# Patient Record
Sex: Female | Born: 1980 | Race: White | Hispanic: No | Marital: Single | Smoking: Never smoker
Health system: Southern US, Community
[De-identification: ages and names within clinical notes are randomized; demographics above are authoritative.]

## PROBLEM LIST (undated history)

## (undated) DIAGNOSIS — Z77120 Contact with and (suspected) exposure to mold (toxic): Secondary | ICD-10-CM

## (undated) DIAGNOSIS — E039 Hypothyroidism, unspecified: Secondary | ICD-10-CM

## (undated) DIAGNOSIS — E559 Vitamin D deficiency, unspecified: Secondary | ICD-10-CM

## (undated) DIAGNOSIS — Z87442 Personal history of urinary calculi: Secondary | ICD-10-CM

## (undated) DIAGNOSIS — U071 COVID-19: Secondary | ICD-10-CM

## (undated) DIAGNOSIS — F419 Anxiety disorder, unspecified: Secondary | ICD-10-CM

## (undated) DIAGNOSIS — R7989 Other specified abnormal findings of blood chemistry: Secondary | ICD-10-CM

## (undated) DIAGNOSIS — Q819 Epidermolysis bullosa, unspecified: Secondary | ICD-10-CM

## (undated) DIAGNOSIS — E618 Deficiency of other specified nutrient elements: Secondary | ICD-10-CM

## (undated) DIAGNOSIS — G4700 Insomnia, unspecified: Secondary | ICD-10-CM

## (undated) DIAGNOSIS — M2550 Pain in unspecified joint: Secondary | ICD-10-CM

## (undated) DIAGNOSIS — R002 Palpitations: Secondary | ICD-10-CM

## (undated) DIAGNOSIS — R5383 Other fatigue: Secondary | ICD-10-CM

## (undated) HISTORY — DX: Insomnia, unspecified: G47.00

## (undated) HISTORY — DX: Palpitations: R00.2

## (undated) HISTORY — DX: Vitamin D deficiency, unspecified: E55.9

## (undated) HISTORY — DX: Anxiety disorder, unspecified: F41.9

## (undated) HISTORY — DX: Deficiency of other specified nutrient elements: E61.8

## (undated) HISTORY — DX: Other fatigue: R53.83

## (undated) HISTORY — DX: Other specified abnormal findings of blood chemistry: R79.89

## (undated) HISTORY — DX: Contact with and (suspected) exposure to mold (toxic): Z77.120

## (undated) HISTORY — PX: FOOT HARDWARE REMOVAL: SHX1661

## (undated) HISTORY — DX: Epidermolysis bullosa, unspecified: Q81.9

## (undated) HISTORY — DX: COVID-19: U07.1

## (undated) HISTORY — DX: Pain in unspecified joint: M25.50

---
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
## 2000-12-06 ENCOUNTER — Ambulatory Visit (HOSPITAL_COMMUNITY): Admission: RE | Admit: 2000-12-06 | Discharge: 2000-12-06 | Payer: Self-pay | Admitting: Family Medicine

## 2000-12-06 ENCOUNTER — Encounter: Payer: Self-pay | Admitting: Family Medicine

## 2011-11-23 ENCOUNTER — Other Ambulatory Visit (HOSPITAL_COMMUNITY)
Admission: RE | Admit: 2011-11-23 | Discharge: 2011-11-23 | Disposition: A | Discharge status: Home / Self Care | Payer: BC Managed Care – PPO | Source: Ambulatory Visit | Attending: Obstetrics and Gynecology | Admitting: Obstetrics and Gynecology

## 2011-11-23 DIAGNOSIS — Z01419 Encounter for gynecological examination (general) (routine) without abnormal findings: Secondary | ICD-10-CM | POA: Insufficient documentation

## 2011-11-23 DIAGNOSIS — Z1151 Encounter for screening for human papillomavirus (HPV): Secondary | ICD-10-CM | POA: Insufficient documentation

## 2013-12-25 ENCOUNTER — Other Ambulatory Visit (HOSPITAL_COMMUNITY)
Admission: RE | Admit: 2013-12-25 | Discharge: 2013-12-25 | Disposition: A | Discharge status: Home / Self Care | Payer: BC Managed Care – PPO | Source: Ambulatory Visit | Attending: Nurse Practitioner | Admitting: Nurse Practitioner

## 2013-12-25 ENCOUNTER — Other Ambulatory Visit: Payer: Self-pay | Admitting: Nurse Practitioner

## 2013-12-25 DIAGNOSIS — Z01419 Encounter for gynecological examination (general) (routine) without abnormal findings: Secondary | ICD-10-CM | POA: Diagnosis present

## 2013-12-25 DIAGNOSIS — Z1151 Encounter for screening for human papillomavirus (HPV): Secondary | ICD-10-CM | POA: Diagnosis present

## 2013-12-26 LAB — CYTOLOGY - PAP

## 2017-07-23 ENCOUNTER — Other Ambulatory Visit: Payer: Self-pay | Admitting: Orthopaedic Surgery

## 2017-07-23 DIAGNOSIS — G5762 Lesion of plantar nerve, left lower limb: Secondary | ICD-10-CM

## 2017-07-23 DIAGNOSIS — M2012 Hallux valgus (acquired), left foot: Secondary | ICD-10-CM

## 2017-07-23 DIAGNOSIS — M2011 Hallux valgus (acquired), right foot: Secondary | ICD-10-CM

## 2020-02-27 ENCOUNTER — Telehealth: Payer: Self-pay

## 2020-02-27 NOTE — Telephone Encounter (Signed)
NOTES ON FILE FROM [facility] [id number], SENT REFERRAL TO SCHEDULING

## 2020-03-14 NOTE — Progress Notes (Signed)
Cardiology Office Note:    Date:  03/16/2020   ID:  Amanda Gibson, DOB 09-Aug-1980, MRN [id number]  PCP:  Jeanann Lewandowsky, MD   [facility]  Cardiologist:  No primary care provider on file.  Advanced Practice Provider:  No care team member to display Electrophysiologist:  None   Referring MD: Jeanann Lewandowsky, MD    History of Present Illness:    Amanda Gibson is a 40 y.o. female who was referred by Dr. Lyla Son for further evaluation of palpitations.  Patient states that she developed palpitations after COVID infection in 2020. Was intially worse immediately after being infected and while symptoms have overall improved, they have continued to be bothersome. Episodes usually occur daily to every other day. Each episode lasts a couple of minutes at a time before resolving. Usually notices them most when sitting at work or laying down at night. Has been treated for EBV and feels like this has helped. Has also decreased caffeine intake and other stimulants to try to decrease frequency, which has helped. She just want to ensure that she is safe to exercise and see if she can have a monitor to determine what her palpitations are caused by. Had trialed propranolol which may have helped a little. Off the medication currently as she wanted to be evaluated off the medication.  No chest pain, LE edema, lightheadedness, syncope, nausea or vomiting. Previously had SOB and fatigue after COVID but has improved.   Past Medical History:  Diagnosis Date  . Anxiety   . COVID   . EB (epidermolysis bullosa)   . Fatigue   . Insomnia   . Joint pain   . Low thyroid stimulating hormone (TSH) level   . Mineral deficiency   . Mold exposure   . Palpitations   . Vitamin D deficiency     Past Surgical History:  Procedure Laterality Date  . FOOT HARDWARE REMOVAL      Current Medications: Current Meds  Medication Sig  . 5-Hydroxytryptophan (5-HTP PO) Take 1 tablet  by mouth daily at 12 noon.  Mack Guise THYROID 15 MG tablet Take 15 mg by mouth daily.  Marland Kitchen ascorbic acid (VITAMIN C) 1000 MG tablet Take 2 tablets by mouth daily.  . Cholecalciferol 125 MCG (5000 UT) TABS Take 2 tablets by mouth daily.  Chilton Si Tea, Camellia sinensis, (GREEN TEA EXTRACT) 150 MG CAPS Take 1 capsule by mouth daily.  Marland Kitchen L-Lysine 500 MG TABS Take 2 tablets by mouth daily.  . Magnesium 250 MG TABS Take 600 mg by mouth daily.  . progesterone (PROMETRIUM) 100 MG capsule Take 1 capsule by mouth daily.  Marland Kitchen pyridOXINE (B-6) 50 MG tablet Take 1 tablet by mouth daily.  . S-Adenosylmethionine 200 MG TABS Take 1 tablet by mouth daily.  . vitamin B-12 (CYANOCOBALAMIN) 100 MCG tablet Take 100 mcg by mouth daily.     Allergies:   Amoxicillin   Social History   Socioeconomic History  . Marital status: Single    Spouse name: Not on file  . Number of children: Not on file  . Years of education: Not on file  . Highest education level: Not on file  Occupational History  . Not on file  Tobacco Use  . Smoking status: Never Smoker  . Smokeless tobacco: Never Used  Substance and Sexual Activity  . Alcohol use: Not on file  . Drug use: Not on file  . Sexual activity: Not on file  Other Topics  Concern  . Not on file  Social History Narrative  . Not on file   Social Determinants of Health   Financial Resource Strain: Not on file  Food Insecurity: Not on file  Transportation Needs: Not on file  Physical Activity: Not on file  Stress: Not on file  Social Connections: Not on file     Family History: The patient's family history includes Asthma in her mother; Cancer in her maternal grandfather and paternal grandmother; Colon polyps in her mother; Dementia in her maternal grandmother.  ROS:   Please see the history of present illness.    Review of Systems  Constitutional: Positive for malaise/fatigue. Negative for chills and fever.  HENT: Negative for hearing loss.   Eyes: Negative  for blurred vision.  Respiratory: Negative for shortness of breath.   Cardiovascular: Positive for palpitations. Negative for chest pain, orthopnea, claudication, leg swelling and PND.  Gastrointestinal: Negative for melena, nausea and vomiting.  Genitourinary: Negative for dysuria and flank pain.  Musculoskeletal: Positive for myalgias. Negative for falls.  Neurological: Negative for dizziness and loss of consciousness.  Psychiatric/Behavioral: Negative for suicidal ideas.    EKGs/Labs/Other Studies Reviewed:    The following studies were reviewed today: No cardiac studies  EKG:  EKG is  ordered today.  The ekg ordered today demonstrates NSR with HR 68  Recent Labs: No results found for requested labs within last 8760 hours.  Recent Lipid Panel No results found for: CHOL, TRIG, HDL, CHOLHDL, VLDL, LDLCALC, LDLDIRECT    Physical Exam:    VS:  BP 106/72   Pulse 68   Ht 5\' 5"  (1.651 m)   Wt 142 lb (64.4 kg)   SpO2 97%   BMI 23.63 kg/m     Wt Readings from Last 3 Encounters:  03/16/20 142 lb (64.4 kg)     GEN:  Well nourished, well developed in no acute distress HEENT: Normal NECK: No JVD; No carotid bruits CARDIAC: RRR, no murmurs, rubs, gallops RESPIRATORY:  Clear to auscultation without rales, wheezing or rhonchi  ABDOMEN: Soft, non-tender, non-distended MUSCULOSKELETAL:  No edema; No deformity  SKIN: Warm and dry NEUROLOGIC:  Alert and oriented x 3 PSYCHIATRIC:  Normal affect   ASSESSMENT:    1. Palpitations    PLAN:    In order of problems listed above:  #Palpitations: Patient developed palpitations after having COVID in fall of 2020. Overall improved, however, symptoms have persisted. Symptoms occurring daily to every other day lasting a couple of minutes at a time. No lightheadedness, dizziness, syncope, exertional symptoms or HF symptoms. ECG here with sinus rhythm with no ectopy.  -Check 7 day zio -Maintain adequate hydration -Patient has cut back on  caffeine and avoiding stimulants like sudafed -Continue magnesium supplementation   Medication Adjustments/Labs and Tests Ordered: Current medicines are reviewed at length with the patient today.  Concerns regarding medicines are outlined above.  Orders Placed This Encounter  Procedures  . LONG TERM MONITOR (3-14 DAYS)  . EKG 12-Lead   No orders of the defined types were placed in this encounter.   Patient Instructions  Medication Instructions:  Your physician recommends that you continue on your current medications as directed. Please refer to the Current Medication list given to you today.  *If you need a refill on your cardiac medications before your next appointment, please call your pharmacy*   Lab Work: None ordered   If you have labs (blood work) drawn today and your tests are completely normal, you will  receive your results only by: Marland Kitchen MyChart Message (if you have MyChart) OR . A paper copy in the mail If you have any lab test that is abnormal or we need to change your treatment, we will call you to review the results.   Testing/Procedures: A zio monitor was ordered today. It will remain on for 7 days. You will then return monitor and event diary in provided box. It takes 1-2 weeks for report to be downloaded and returned to [place]. We will call you with the results. If monitor falls off or has orange flashing light, please call Zio for further instructions.      Follow-Up: At [facility], you and your health needs are our priority.  As part of our continuing mission to provide you with exceptional heart care, we have created designated Provider Care Teams.  These Care Teams include your primary Cardiologist (physician) and Advanced Practice Providers (APPs -  Physician Assistants and Nurse Practitioners) who all work together to provide you with the care you need, when you need it.  We recommend signing up for the patient portal called "MyChart".  Sign up information is  provided on this After Visit Summary.  MyChart is used to connect with patients for Virtual Visits (Telemedicine).  Patients are able to view lab/test results, encounter notes, upcoming appointments, etc.  Non-urgent messages can be sent to your provider as well.   To learn more about what you can do with MyChart, go to [email or web address].    Your next appointment:   6 month(s)  The format for your next appointment:   In Person  Provider:   You may see Dr. Laurance Flatten or one of the following Advanced Practice Providers on your designated Care Team:    Tereso Newcomer, PA-C  Vin Iver Nestle, [place]    Other Instructions Christena Deem- Long Term Monitor Instructions   Your physician has requested you wear your ZIO patch monitor 7 days.   This is a single patch monitor.  [employer] supplies one patch monitor per enrollment.  Additional stickers are not available.   Please do not apply patch if you will be having a Nuclear Stress Test, Echocardiogram, Cardiac CT, MRI, or Chest Xray during the time frame you would be wearing the monitor. The patch cannot be worn during these tests.  You cannot remove and re-apply the ZIO XT patch monitor.   Your ZIO patch monitor will be sent USPS Priority mail from [facility] directly to your home address. The monitor may also be mailed to a PO BOX if home delivery is not available.   It may take 3-5 days to receive your monitor after you have been enrolled.   Once you have received you monitor, please review enclosed instructions.  Your monitor has already been registered assigning a specific monitor serial # to you.   Applying the monitor   Shave hair from upper left chest.   Hold abrader disc by orange tab.  Rub abrader in 40 strokes over left upper chest as indicated in your monitor instructions.   Clean area with 4 enclosed alcohol pads .  Use all pads to assure are is cleaned thoroughly.  Let dry.   Apply patch as indicated in monitor  instructions.  Patch will be place under collarbone on left side of chest with arrow pointing upward.   Rub patch adhesive wings for 2 minutes.Remove white label marked "1".  Remove white label marked "2".  Rub patch adhesive wings for 2 additional  minutes.   While looking in a mirror, press and release button in center of patch.  A small green light will flash 3-4 times .  This will be your only indicator the monitor has been turned on.     Do not shower for the first 24 hours.  You may shower after the first 24 hours.   Press button if you feel a symptom. You will hear a small click.  Record Date, Time and Symptom in the Patient Log Book.   When you are ready to remove patch, follow instructions on last 2 pages of Patient Log Book.  Stick patch monitor onto last page of Patient Log Book.   Place Patient Log Book in [place] box.  Use locking tab on box and tape box closed securely.  The [employer] box has [employer] postage on it.  Please place in mailbox as soon as possible.  Your physician should have your test results approximately 7 days after the monitor has been [facility].   Call [facility] Customer Care at [phone] if you have questions regarding your ZIO XT patch monitor.  Call them immediately if you see an orange light blinking on your monitor.   If your monitor falls off in less than 4 days contact our Monitor department at [phone].  If your monitor becomes loose or falls off after 4 days call [employer] at [phone] for suggestions on securing your monitor.       Signed, Meriam SpragueHeather E Nyomi Howser, MD  03/16/2020 5:28 PM    [facility]

## 2020-03-16 ENCOUNTER — Encounter (INDEPENDENT_AMBULATORY_CARE_PROVIDER_SITE_OTHER): Payer: Self-pay

## 2020-03-16 ENCOUNTER — Encounter: Payer: Self-pay | Admitting: Radiology

## 2020-03-16 ENCOUNTER — Other Ambulatory Visit: Payer: Self-pay

## 2020-03-16 ENCOUNTER — Encounter: Payer: Self-pay | Admitting: Cardiology

## 2020-03-16 ENCOUNTER — Ambulatory Visit (INDEPENDENT_AMBULATORY_CARE_PROVIDER_SITE_OTHER): Payer: BC Managed Care – PPO | Admitting: Cardiology

## 2020-03-16 ENCOUNTER — Ambulatory Visit (INDEPENDENT_AMBULATORY_CARE_PROVIDER_SITE_OTHER): Payer: BC Managed Care – PPO

## 2020-03-16 VITALS — BP 106/72 | HR 68 | Ht 65.0 in | Wt 142.0 lb

## 2020-03-16 DIAGNOSIS — R002 Palpitations: Secondary | ICD-10-CM

## 2020-03-16 NOTE — Progress Notes (Signed)
Enrolled patient for a 7 day Zio XT Monitor to be mailed to patients home.  

## 2020-03-16 NOTE — Patient Instructions (Signed)
Medication Instructions:  Your physician recommends that you continue on your current medications as directed. Please refer to the Current Medication list given to you today.  *If you need a refill on your cardiac medications before your next appointment, please call your pharmacy*   Lab Work: None ordered   If you have labs (blood work) drawn today and your tests are completely normal, you will receive your results only by: Marland Kitchen MyChart Message (if you have MyChart) OR . A paper copy in the mail If you have any lab test that is abnormal or we need to change your treatment, we will call you to review the results.   Testing/Procedures: A zio monitor was ordered today. It will remain on for 7 days. You will then return monitor and event diary in provided box. It takes 1-2 weeks for report to be downloaded and returned to [place]. We will call you with the results. If monitor falls off or has orange flashing light, please call Zio for further instructions.      Follow-Up: At [facility], you and your health needs are our priority.  As part of our continuing mission to provide you with exceptional heart care, we have created designated Provider Care Teams.  These Care Teams include your primary Cardiologist (physician) and Advanced Practice Providers (APPs -  Physician Assistants and Nurse Practitioners) who all work together to provide you with the care you need, when you need it.  We recommend signing up for the patient portal called "MyChart".  Sign up information is provided on this After Visit Summary.  MyChart is used to connect with patients for Virtual Visits (Telemedicine).  Patients are able to view lab/test results, encounter notes, upcoming appointments, etc.  Non-urgent messages can be sent to your provider as well.   To learn more about what you can do with MyChart, go to [email or web address].    Your next appointment:   6 month(s)  The format for your next appointment:   In  Person  Provider:   You may see Dr. Laurance Flatten or one of the following Advanced Practice Providers on your designated Care Team:    Tereso Newcomer, PA-C  Vin Iver Nestle, [place]    Other Instructions Amanda Gibson- Long Term Monitor Instructions   Your physician has requested you wear your ZIO patch monitor 7 days.   This is a single patch monitor.  Irhythm supplies one patch monitor per enrollment.  Additional stickers are not available.   Please do not apply patch if you will be having a Nuclear Stress Test, Echocardiogram, Cardiac CT, MRI, or Chest Xray during the time frame you would be wearing the monitor. The patch cannot be worn during these tests.  You cannot remove and re-apply the ZIO XT patch monitor.   Your ZIO patch monitor will be sent USPS Priority mail from [facility] directly to your home address. The monitor may also be mailed to a PO BOX if home delivery is not available.   It may take 3-5 days to receive your monitor after you have been enrolled.   Once you have received you monitor, please review enclosed instructions.  Your monitor has already been registered assigning a specific monitor serial # to you.   Applying the monitor   Shave hair from upper left chest.   Hold abrader disc by orange tab.  Rub abrader in 40 strokes over left upper chest as indicated in your monitor instructions.   Clean area with 4 enclosed alcohol  pads .  Use all pads to assure are is cleaned thoroughly.  Let dry.   Apply patch as indicated in monitor instructions.  Patch will be place under collarbone on left side of chest with arrow pointing upward.   Rub patch adhesive wings for 2 minutes.Remove white label marked "1".  Remove white label marked "2".  Rub patch adhesive wings for 2 additional minutes.   While looking in a mirror, press and release button in center of patch.  A small green light will flash 3-4 times .  This will be your only indicator the monitor has been turned  on.     Do not shower for the first 24 hours.  You may shower after the first 24 hours.   Press button if you feel a symptom. You will hear a small click.  Record Date, Time and Symptom in the Patient Log Book.   When you are ready to remove patch, follow instructions on last 2 pages of Patient Log Book.  Stick patch monitor onto last page of Patient Log Book.   Place Patient Log Book in [place] box.  Use locking tab on box and tape box closed securely.  The Orange and [employer] has [employer] on it.  Please place in mailbox as soon as possible.  Your physician should have your test results approximately 7 days after the monitor has been mailed back to [facility]   Call [facility] Customer Care at [phone] if you have questions regarding your ZIO XT patch monitor.  Call them immediately if you see an orange light blinking on your monitor.   If your monitor falls off in less than 4 days contact our Monitor department at [phone].  If your monitor becomes loose or falls off after 4 days call [employer] at [phone] for suggestions on securing your monitor.

## 2020-03-24 DIAGNOSIS — R002 Palpitations: Secondary | ICD-10-CM | POA: Diagnosis not present

## 2020-08-27 ENCOUNTER — Other Ambulatory Visit: Payer: Self-pay | Admitting: Nurse Practitioner

## 2020-08-27 DIAGNOSIS — Z1231 Encounter for screening mammogram for malignant neoplasm of breast: Secondary | ICD-10-CM

## 2020-09-22 ENCOUNTER — Other Ambulatory Visit: Payer: Self-pay

## 2020-09-22 ENCOUNTER — Ambulatory Visit
Admission: RE | Admit: 2020-09-22 | Discharge: 2020-09-22 | Disposition: A | Discharge status: Home / Self Care | Payer: BC Managed Care – PPO | Source: Ambulatory Visit | Attending: Nurse Practitioner | Admitting: Nurse Practitioner

## 2020-09-22 DIAGNOSIS — Z1231 Encounter for screening mammogram for malignant neoplasm of breast: Secondary | ICD-10-CM

## 2020-09-27 ENCOUNTER — Other Ambulatory Visit: Payer: Self-pay | Admitting: Nurse Practitioner

## 2020-09-27 DIAGNOSIS — R928 Other abnormal and inconclusive findings on diagnostic imaging of breast: Secondary | ICD-10-CM

## 2020-10-18 ENCOUNTER — Other Ambulatory Visit: Payer: Self-pay | Admitting: Nurse Practitioner

## 2020-10-18 ENCOUNTER — Ambulatory Visit
Admission: RE | Admit: 2020-10-18 | Discharge: 2020-10-18 | Disposition: A | Discharge status: Home / Self Care | Payer: BC Managed Care – PPO | Source: Ambulatory Visit | Attending: Nurse Practitioner | Admitting: Nurse Practitioner

## 2020-10-18 ENCOUNTER — Other Ambulatory Visit: Payer: Self-pay

## 2020-10-18 DIAGNOSIS — N6489 Other specified disorders of breast: Secondary | ICD-10-CM

## 2020-10-18 DIAGNOSIS — R928 Other abnormal and inconclusive findings on diagnostic imaging of breast: Secondary | ICD-10-CM

## 2021-05-11 ENCOUNTER — Ambulatory Visit
Admission: RE | Admit: 2021-05-11 | Discharge: 2021-05-11 | Disposition: A | Discharge status: Home / Self Care | Payer: BC Managed Care – PPO | Source: Ambulatory Visit | Attending: Nurse Practitioner | Admitting: Nurse Practitioner

## 2021-05-11 ENCOUNTER — Other Ambulatory Visit: Payer: Self-pay | Admitting: Nurse Practitioner

## 2021-05-11 DIAGNOSIS — N6489 Other specified disorders of breast: Secondary | ICD-10-CM

## 2021-11-11 ENCOUNTER — Other Ambulatory Visit: Payer: Self-pay | Admitting: Nurse Practitioner

## 2021-11-11 ENCOUNTER — Ambulatory Visit
Admission: RE | Admit: 2021-11-11 | Discharge: 2021-11-11 | Disposition: A | Discharge status: Home / Self Care | Payer: BC Managed Care – PPO | Source: Ambulatory Visit | Attending: Nurse Practitioner | Admitting: Nurse Practitioner

## 2021-11-11 DIAGNOSIS — N6489 Other specified disorders of breast: Secondary | ICD-10-CM

## 2021-11-11 DIAGNOSIS — R921 Mammographic calcification found on diagnostic imaging of breast: Secondary | ICD-10-CM

## 2021-11-24 ENCOUNTER — Ambulatory Visit
Admission: RE | Admit: 2021-11-24 | Discharge: 2021-11-24 | Disposition: A | Discharge status: Home / Self Care | Payer: BC Managed Care – PPO | Source: Ambulatory Visit | Attending: Nurse Practitioner | Admitting: Nurse Practitioner

## 2021-11-24 DIAGNOSIS — R921 Mammographic calcification found on diagnostic imaging of breast: Secondary | ICD-10-CM

## 2021-11-24 HISTORY — PX: BREAST BIOPSY: SHX20

## 2023-04-15 IMAGING — MG MM DIGITAL DIAGNOSTIC UNILAT*L* W/ TOMO W/ CAD
8 series · 8 of 24 positions shown · non-contrast
Comparison: Previous exams including diagnostic mammogram and
ultrasound dated 10/18/2020.

CLINICAL DATA: Follow-up for probably benign asymmetry in the upper
LEFT breast. This probably benign asymmetry was initially identified
on baseline screening mammogram dated 09/22/2020.

EXAM:
DIGITAL DIAGNOSTIC UNILATERAL LEFT MAMMOGRAM WITH TOMOSYNTHESIS AND
CAD
TECHNIQUE: Left digital diagnostic mammography and breast tomosynthesis was
performed. The images were evaluated with computer-aided detection.

[L MLO synth-2D (1 of 2)]
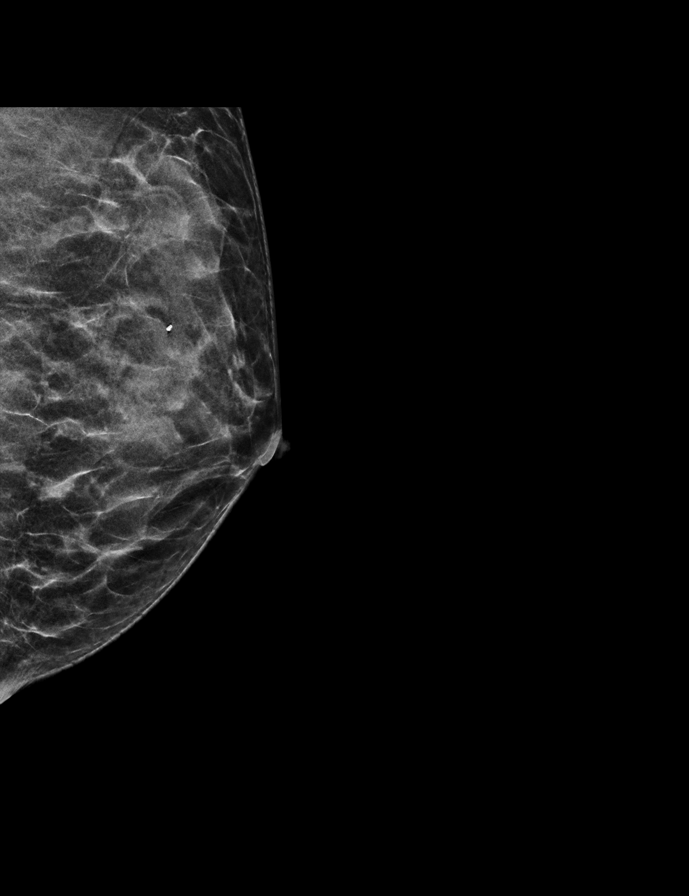

[L MLO synth-2D (2 of 2)]
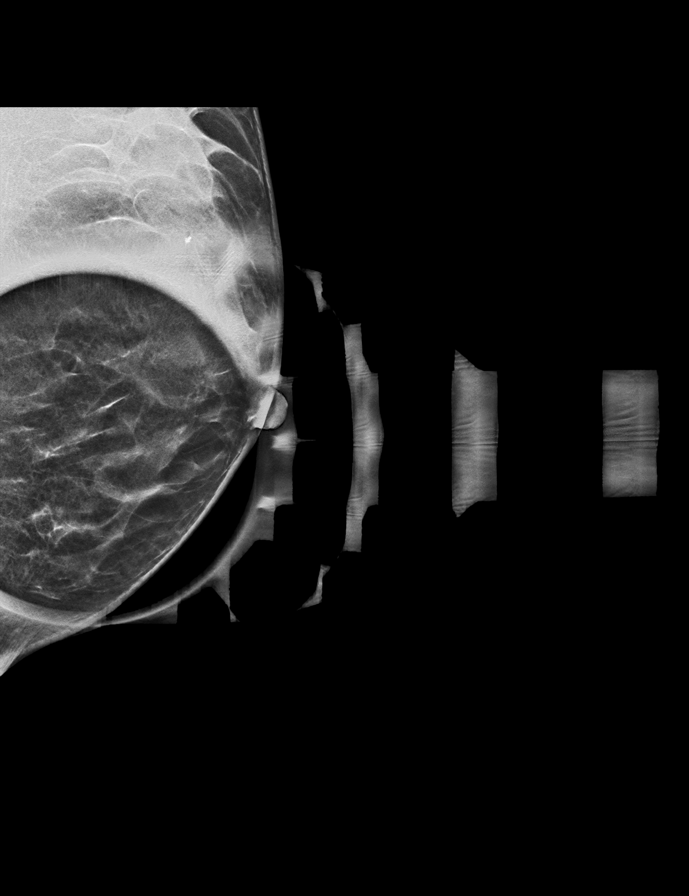

[L CC synth-2D (1 of 2)]
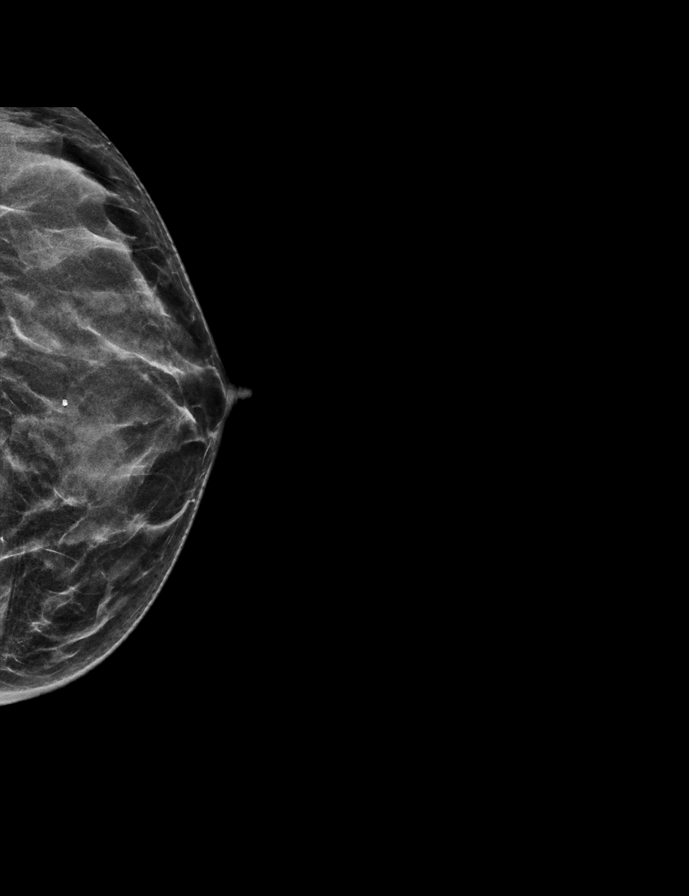

[L CC synth-2D (2 of 2)]
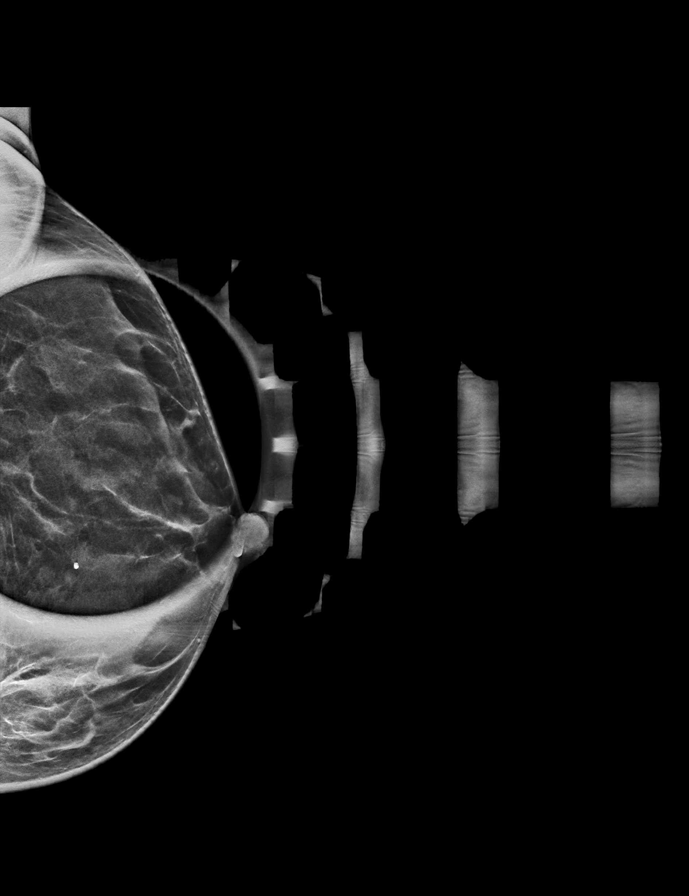

[L CC tomo (1 of 2) · tomo slice 21/42.0]
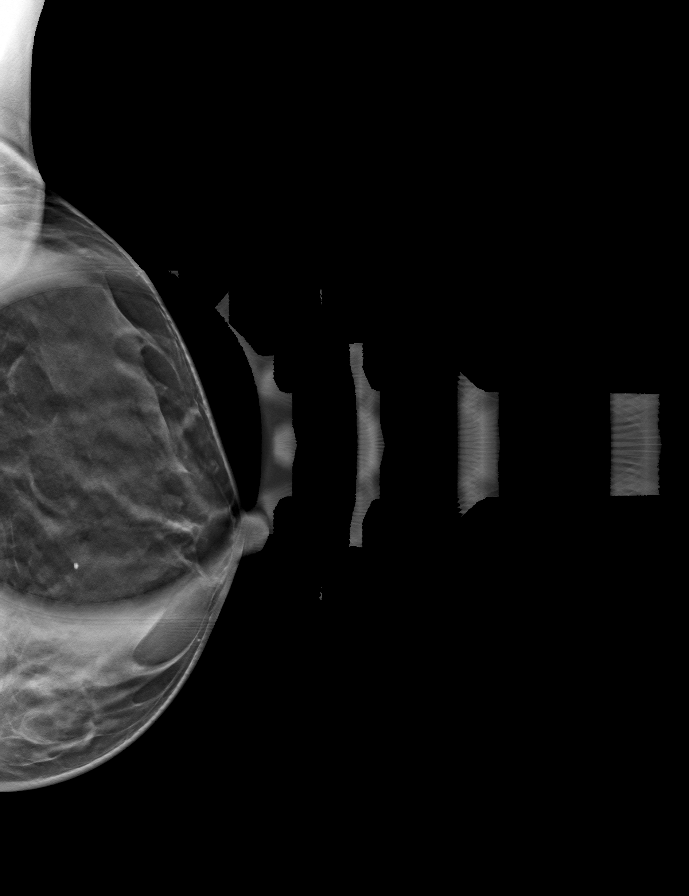

[L MLO tomo (1 of 2) · tomo slice 19/36.0]
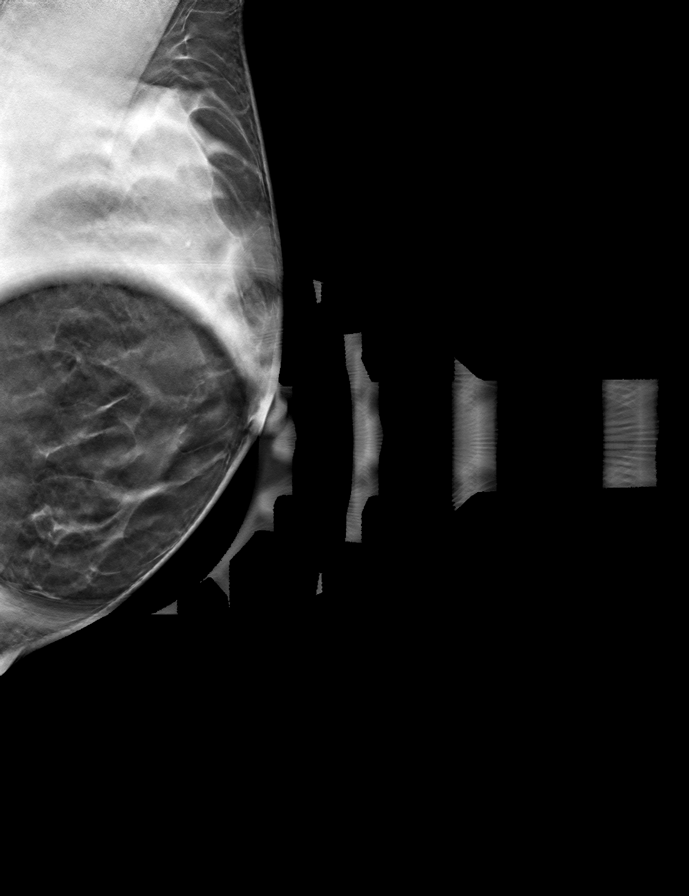

[L MLO tomo (2 of 2) · tomo slice 21/40.0]
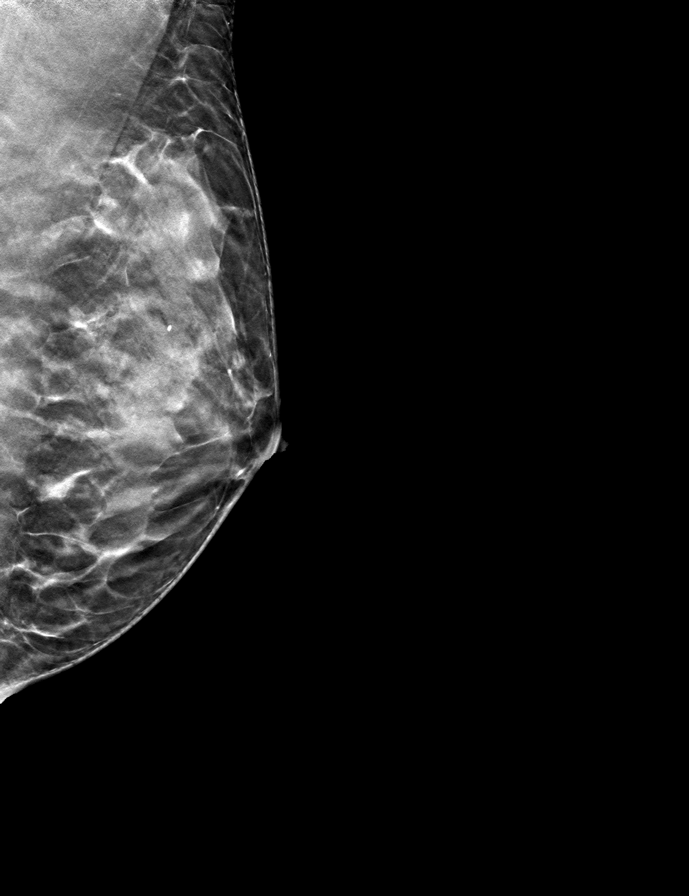

[L CC tomo (2 of 2) · tomo slice 22/43.0]
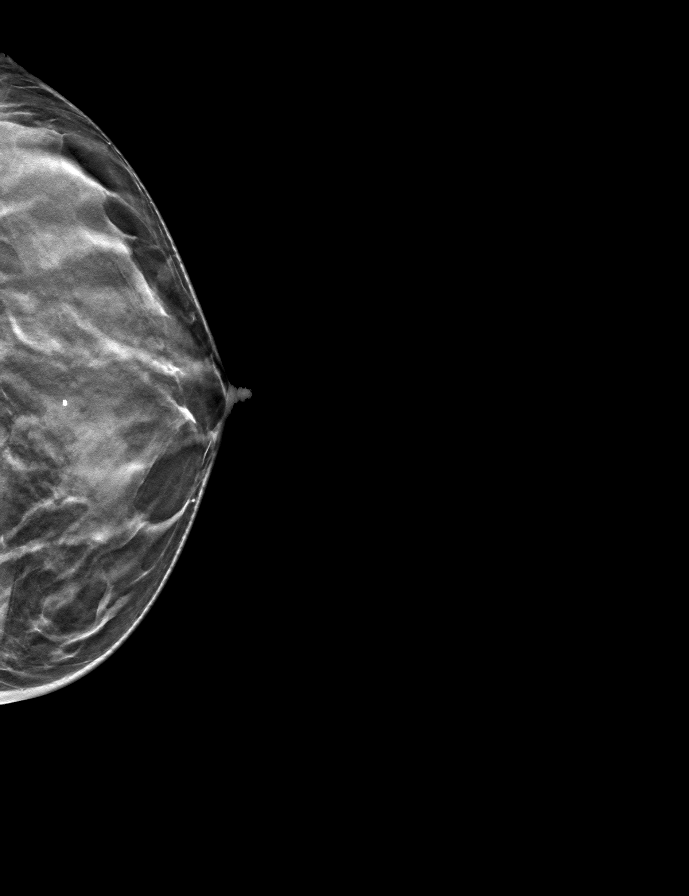

[8 of 24 positions shown; findings below may reference images not displayed]

ACR Breast Density Category c: The breast tissue is heterogeneously
dense, which may obscure small masses.
FINDINGS: Overall fibroglandular pattern of the LEFT breast is stable.
Specifically, the fibroglandular pattern of the upper LEFT breast is
again most compatible with an island of normal dense fibroglandular
tissues. There are no new dominant masses, suspicious calcifications
or secondary signs of malignancy identified within the LEFT breast
on today's exam.
IMPRESSION: Stable probably benign island of normal dense fibroglandular tissue
within the upper LEFT breast. Recommend additional follow-up
diagnostic mammogram in 6 months to ensure continued stability.

RECOMMENDATION:
Bilateral diagnostic mammogram in 6 months.

I have discussed the findings and recommendations with the patient.
If applicable, a reminder letter will be sent to the patient
regarding the next appointment.

BI-RADS CATEGORY  3: Probably benign.

## 2023-04-25 ENCOUNTER — Encounter (HOSPITAL_COMMUNITY): Payer: Self-pay | Admitting: *Deleted

## 2023-04-25 ENCOUNTER — Other Ambulatory Visit: Payer: Self-pay

## 2023-04-25 ENCOUNTER — Emergency Department (HOSPITAL_COMMUNITY)

## 2023-04-25 ENCOUNTER — Emergency Department (HOSPITAL_COMMUNITY)
Admission: EM | Admit: 2023-04-25 | Discharge: 2023-04-25 | Disposition: A | Discharge status: Home / Self Care | Attending: Emergency Medicine | Admitting: Emergency Medicine

## 2023-04-25 DIAGNOSIS — N132 Hydronephrosis with renal and ureteral calculous obstruction: Secondary | ICD-10-CM | POA: Diagnosis not present

## 2023-04-25 DIAGNOSIS — R1030 Lower abdominal pain, unspecified: Secondary | ICD-10-CM | POA: Diagnosis present

## 2023-04-25 DIAGNOSIS — N200 Calculus of kidney: Secondary | ICD-10-CM

## 2023-04-25 LAB — CBC
HCT: 41 % (ref 36.0–46.0)
Hemoglobin: 14.1 g/dL (ref 12.0–15.0)
MCH: 33.4 pg (ref 26.0–34.0)
MCHC: 34.4 g/dL (ref 30.0–36.0)
MCV: 97.2 fL (ref 80.0–100.0)
Platelets: 199 10*3/uL (ref 150–400)
RBC: 4.22 MIL/uL (ref 3.87–5.11)
RDW: 12.2 % (ref 11.5–15.5)
WBC: 6.4 10*3/uL (ref 4.0–10.5)
nRBC: 0 % (ref 0.0–0.2)

## 2023-04-25 LAB — COMPREHENSIVE METABOLIC PANEL WITH GFR
ALT: 16 U/L (ref 0–44)
AST: 24 U/L (ref 15–41)
Albumin: 3.9 g/dL (ref 3.5–5.0)
Alkaline Phosphatase: 28 U/L — ABNORMAL LOW (ref 38–126)
Anion gap: 12 (ref 5–15)
BUN: 21 mg/dL — ABNORMAL HIGH (ref 6–20)
CO2: 20 mmol/L — ABNORMAL LOW (ref 22–32)
Calcium: 8.7 mg/dL — ABNORMAL LOW (ref 8.9–10.3)
Chloride: 103 mmol/L (ref 98–111)
Creatinine, Ser: 0.94 mg/dL (ref 0.44–1.00)
GFR, Estimated: >60 mL/min (ref >60)
Glucose, Bld: 162 mg/dL — ABNORMAL HIGH (ref 70–99)
Potassium: 3.6 mmol/L (ref 3.5–5.1)
Sodium: 135 mmol/L (ref 135–145)
Total Bilirubin: 0.8 mg/dL (ref 0.0–1.2)
Total Protein: 6.5 g/dL (ref 6.5–8.1)

## 2023-04-25 LAB — LIPASE, BLOOD: Lipase: 34 U/L (ref 11–51)

## 2023-04-25 LAB — HCG, SERUM, QUALITATIVE: Preg, Serum: NEGATIVE

## 2023-04-25 MED ORDER — TAMSULOSIN HCL 0.4 MG PO CAPS: 0.4000 mg | ORAL_CAPSULE | Freq: Every day | ORAL | 0 refills | Status: AC

## 2023-04-25 MED ORDER — ONDANSETRON HCL 4 MG/2ML IJ SOLN
4.0000 mg | Freq: Once | INTRAMUSCULAR | Status: AC | PRN
  Administered 2023-04-25: 4 mg via INTRAVENOUS
  Filled 2023-04-25: qty 2

## 2023-04-25 MED ORDER — OXYCODONE-ACETAMINOPHEN 5-325 MG PO TABS: 1.0000 | ORAL_TABLET | Freq: Four times a day (QID) | ORAL | 0 refills | Status: AC | PRN

## 2023-04-25 MED ORDER — FENTANYL CITRATE PF 50 MCG/ML IJ SOSY
50.0000 ug | PREFILLED_SYRINGE | Freq: Once | INTRAMUSCULAR | Status: AC
  Administered 2023-04-25: 50 ug via INTRAVENOUS
  Filled 2023-04-25: qty 1

## 2023-04-25 MED ORDER — IBUPROFEN 600 MG PO TABS: 600.0000 mg | ORAL_TABLET | Freq: Four times a day (QID) | ORAL | 0 refills | Status: AC | PRN

## 2023-04-25 MED ORDER — ONDANSETRON 4 MG PO TBDP: 4.0000 mg | ORAL_TABLET | Freq: Three times a day (TID) | ORAL | 0 refills | Status: AC | PRN

## 2023-04-25 MED ORDER — TAMSULOSIN HCL 0.4 MG PO CAPS
0.4000 mg | ORAL_CAPSULE | Freq: Once | ORAL | Status: AC
  Administered 2023-04-25: 0.4 mg via ORAL
  Filled 2023-04-25: qty 1

## 2023-04-25 MED ORDER — SODIUM CHLORIDE 0.9 % IV SOLN: Freq: Once | INTRAVENOUS | Status: AC

## 2023-04-25 MED ORDER — FENTANYL CITRATE PF 50 MCG/ML IJ SOSY
50.0000 ug | PREFILLED_SYRINGE | INTRAMUSCULAR | Status: AC | PRN
  Administered 2023-04-25 (×2): 50 ug via INTRAVENOUS
  Filled 2023-04-25 (×2): qty 1

## 2023-04-25 MED ORDER — METOCLOPRAMIDE HCL 5 MG/ML IJ SOLN
5.0000 mg | Freq: Once | INTRAMUSCULAR | Status: AC
  Filled 2023-04-25: qty 2

## 2023-04-25 MED ORDER — KETOROLAC TROMETHAMINE 30 MG/ML IJ SOLN
30.0000 mg | Freq: Once | INTRAMUSCULAR | Status: AC
  Administered 2023-04-25: 30 mg via INTRAVENOUS
  Filled 2023-04-25: qty 1

## 2023-04-25 MED ORDER — METOCLOPRAMIDE HCL 5 MG/ML IJ SOLN
INTRAMUSCULAR | Status: AC
  Administered 2023-04-25: 5 mg via INTRAVENOUS
  Filled 2023-04-25: qty 2

## 2023-04-25 NOTE — ED Provider Notes (Signed)
 [place] EMERGENCY DEPARTMENT AT [facility] Provider Note   CSN: [id number] Arrival date & time: 04/25/23  4098     History  Chief Complaint  Patient presents with   Abdominal Pain    Amanda Gibson is a 43 y.o. female has medical history significant for anxiety and palpitations presents today for right flank and lower abdominal pain.  That began approximately 1 hour prior to arrival.  Patient states that the pain woke her up from sleep.  Patient endorses nausea and chills.  Patient denies vomiting, fever, dysuria, hematuria, frequency, urgency, diarrhea, chest pain, or shortness of breath.  Patient denies possible pregnancy, or previous abdominal surgeries.   Abdominal Pain Associated symptoms: nausea and vomiting        Home Medications Prior to Admission medications   Medication Sig Start Date End Date Taking? Authorizing Provider  ibuprofen (ADVIL) 600 MG tablet Take 1 tablet (600 mg total) by mouth every 6 (six) hours as needed. 04/25/23  Yes Dolphus Jenny, PA-C  ondansetron (ZOFRAN-ODT) 4 MG disintegrating tablet Take 1 tablet (4 mg total) by mouth every 8 (eight) hours as needed for nausea or vomiting. 04/25/23  Yes Dolphus Jenny, PA-C  oxyCODONE-acetaminophen (PERCOCET/ROXICET) 5-325 MG tablet Take 1 tablet by mouth every 6 (six) hours as needed for up to 5 days for severe pain (pain score 7-10). 04/25/23 04/30/23 Yes Dolphus Jenny, PA-C  tamsulosin (FLOMAX) 0.4 MG CAPS capsule Take 1 capsule (0.4 mg total) by mouth daily for 3 days. 04/25/23 04/28/23 Yes Dolphus Jenny, PA-C  5-Hydroxytryptophan (5-HTP PO) Take 1 tablet by mouth daily at 12 noon.    [provider]  ARMOUR THYROID 15 MG tablet Take 15 mg by mouth daily. 03/04/20   [provider]  ascorbic acid (VITAMIN C) 1000 MG tablet Take 2 tablets by mouth daily.    [provider]  Cholecalciferol 125 MCG (5000 UT) TABS Take 2 tablets by mouth daily. 11/22/16   [provider]  Ch[place] Si Tea, Camellia sinensis, (GREEN TEA EXTRACT) 150 MG CAPS Take 1 capsule by mouth daily.    [provider]  L-Lysine 500 MG TABS Take 2 tablets by mouth daily.    [provider]  Magnesium 250 MG TABS Take 600 mg by mouth daily.    [provider]  progesterone (PROMETRIUM) 100 MG capsule Take 1 capsule by mouth daily. 03/05/20   [provider]  pyridOXINE (B-6) 50 MG tablet Take 1 tablet by mouth daily.    [provider]  S-Adenosylmethionine 200 MG TABS Take 1 tablet by mouth daily.    [provider]  vitamin B-12 (CYANOCOBALAMIN) 100 MCG tablet Take 100 mcg by mouth daily.    [provider]      Allergies    Amoxicillin    Review of Systems   Review of Systems  Gastrointestinal:  Positive for abdominal pain, nausea and vomiting.  Genitourinary:  Positive for flank pain.    Physical Exam Updated Vital Signs BP 126/74   Pulse 64   Temp 97.8 F (36.6 C) (Oral)   Resp 20   SpO2 100%  Physical Exam Vitals and nursing note reviewed.  Constitutional:      General: She is not in acute distress.    Appearance: She is well-developed and normal weight. She is not ill-appearing, toxic-appearing or diaphoretic.     Comments: Uncomfortable appearing  HENT:     Head: Normocephalic and atraumatic.  Mouth/Throat:     Mouth: Mucous membranes are moist.  Eyes:     Extraocular Movements: Extraocular movements intact.     Conjunctiva/sclera: Conjunctivae normal.  Cardiovascular:     Rate and Rhythm: Normal rate and regular rhythm.     Heart sounds: Normal heart sounds. No murmur heard. Pulmonary:     Effort: Pulmonary effort is normal. No respiratory distress.     Breath sounds: Normal breath sounds.  Abdominal:     General: Abdomen is flat. Bowel sounds are normal.     Palpations: Abdomen is soft.     Tenderness: There is abdominal tenderness in the right lower quadrant. There is right CVA tenderness. There  is no left CVA tenderness or guarding. Negative signs include Murphy's sign, Rovsing's sign and McBurney's sign.  Musculoskeletal:        General: No swelling.     Cervical back: Neck supple.  Skin:    General: Skin is warm and dry.     Capillary Refill: Capillary refill takes less than 2 seconds.  Neurological:     General: No focal deficit present.     Mental Status: She is alert.  Psychiatric:        Mood and Affect: Mood normal.     ED Results / Procedures / Treatments   Labs (all labs ordered are listed, but only abnormal results are displayed) Labs Reviewed  COMPREHENSIVE METABOLIC PANEL WITH GFR - Abnormal; Notable for the following components:      Result Value   CO2 20 (*)    Glucose, Bld 162 (*)    BUN 21 (*)    Calcium 8.7 (*)    Alkaline Phosphatase 28 (*)    All other components within normal limits  LIPASE, BLOOD  CBC  HCG, SERUM, QUALITATIVE    EKG None  Radiology CT Renal Stone Study Result Date: 04/25/2023 CLINICAL DATA:  Right flank pain EXAM: CT ABDOMEN AND PELVIS WITHOUT CONTRAST TECHNIQUE: Multidetector CT imaging of the abdomen and pelvis was performed following the standard protocol without IV contrast. RADIATION DOSE REDUCTION: This exam was performed according to the departmental dose-optimization program which includes automated exposure control, adjustment of the mA and/or kV according to patient size and/or use of iterative reconstruction technique. COMPARISON:  None available FINDINGS: Lower chest: No acute abnormality. Hepatobiliary: No focal liver abnormality is seen. No gallstones, gallbladder wall thickening, or biliary dilatation. Pancreas: Unremarkable. No pancreatic ductal dilatation or surrounding inflammatory changes. Spleen: Normal in size without focal abnormality. Adrenals/Urinary Tract: Adrenal glands are unremarkable. There is mild right perinephric fat stranding and minimal hydronephrosis and hydroureter due to 4 mm obstructing calculus  in the proximal ureter. Additional 2 mm nonobstructing calculus present in the upper pole of the right kidney. Left kidney, ureter, and bladder are normal. Stomach/Bowel: No bowel dilatation to indicate ileus or obstruction. The appendix is not definitively visualized. If there is concern for appendicitis, repeat CT exam with IV an oral contrast should be obtained. Vascular/Lymphatic: No significant vascular findings are present. No enlarged abdominal or pelvic lymph nodes. Reproductive: Menstrual cup noted within the vaginal canal. No abnormality of the adnexa. Other: No abdominal wall hernia or abnormality. No abdominopelvic ascites. Musculoskeletal: No acute or significant osseous findings. IMPRESSION: 1. 4 mm obstructing calculus in the proximal right ureter causing minimal hydronephrosis and hydroureter. 2. Additional 2 mm nonobstructing calculus in the upper pole of the right kidney. Electronically Signed   By: Acquanetta Belling M.D.   On: 04/25/2023 08:15  Procedures Procedures    Medications Ordered in ED Medications  tamsulosin (FLOMAX) capsule 0.4 mg (has no administration in time range)  ondansetron (ZOFRAN) injection 4 mg (has no administration in time range)  fentaNYL (SUBLIMAZE) injection 50 mcg (has no administration in time range)  ondansetron (ZOFRAN) injection 4 mg (4 mg Intravenous Given 04/25/23 0548)  fentaNYL (SUBLIMAZE) injection 50 mcg (50 mcg Intravenous Given 04/25/23 0725)  0.9 %  sodium chloride infusion (0 mLs Intravenous Stopped 04/25/23 0719)  fentaNYL (SUBLIMAZE) injection 50 mcg (50 mcg Intravenous Given 04/25/23 0654)  metoCLOPramide (REGLAN) injection 5 mg (5 mg Intravenous Given 04/25/23 0718)  fentaNYL (SUBLIMAZE) injection 50 mcg (50 mcg Intravenous Given 04/25/23 0848)  ketorolac (TORADOL) 30 MG/ML injection 30 mg (30 mg Intravenous Given 04/25/23 0849)    ED Course/ Medical Decision Making/ A&P                                 Medical Decision Making Amount and/or  Complexity of Data Reviewed Labs: ordered.  Risk Prescription drug management.   This patient presents to the ED with chief complaint(s) of R flank and RLQ abd pain with pertinent past medical history of anxiety which further complicates the presenting complaint. The complaint involves an extensive differential diagnosis and also carries with it a high risk of complications and morbidity.    The differential diagnosis includes kidney stone, pyelonephritis, appendicitis, ovarian torsion, ectopic pregnancy, diverticulitis, Crohn's disease  Additional history obtained: Additional history obtained from significant other  ED Course and Reassessment: Patient given fentanyl, Zofran, and Reglan for pain and nausea.  Independent labs interpretation:  The following labs were independently interpreted:  CBC: No notable finding CMP: Mildly decreased CO2 and alk phos, mildly elevated BUN Lipase: 34 Preg: Negative  Independent visualization of imaging: - I independently visualized the following imaging with scope of interpretation limited to determining acute life threatening conditions related to emergency care: CT renal stone study, which revealed 4 mm obstructing calculus in the proximal right ureter, minimal hydronephrosis and hydroureter.  2 mm nonobstructing calculus in the upper pole of the right kidney.  Consultation: - Consulted or discussed management/test interpretation w/ external professional: None  Consideration for admission or further workup: Considered for admission or further workup however patient's vital signs, physical exam, labs, and imaging were reassuring.  Patient symptoms likely due to ureteral stone.  Patient given short course of Flomax, Percocet, Zofran, and ibuprofen.  Patient also given strainer to strain urine until stone passes.  Patient should follow-up with primary care if her symptoms persist for further evaluation and treatment.        Final Clinical  Impression(s) / ED Diagnoses Final diagnoses:  Kidney stone on right side    Rx / DC Orders ED Discharge Orders          Ordered    tamsulosin (FLOMAX) 0.4 MG CAPS capsule  Daily        04/25/23 0923    ondansetron (ZOFRAN-ODT) 4 MG disintegrating tablet  Every 8 hours PRN        04/25/23 0923    oxyCODONE-acetaminophen (PERCOCET/ROXICET) 5-325 MG tablet  Every 6 hours PRN        04/25/23 0923    ibuprofen (ADVIL) 600 MG tablet  Every 6 hours PRN        04/25/23 0923              Dolphus Jenny,  PA-C 04/25/23 2440    Rolan Bucco, MD 04/25/23 1426

## 2023-04-25 NOTE — ED Triage Notes (Signed)
 Pt c/o R flank pain, lower abd pain onset an hour ago that woke her from sleep.

## 2023-04-25 NOTE — Discharge Instructions (Addendum)
 Today you were seen for a kidney stone.  Please pick up your medications and take as prescribed.  Please return to the ED if your pain worsens, you have uncontrollable vomiting, or fever.  Thank you for letting us treat you today. After reviewing your labs and imaging, I feel you are safe to go home. Please follow up with your PCP in the next several days and provide them with your records from this visit. Return to the Emergency Room if pain becomes severe or symptoms worsen.

## 2023-04-27 ENCOUNTER — Other Ambulatory Visit: Payer: Self-pay | Admitting: Urology

## 2023-04-27 NOTE — Progress Notes (Signed)
 Pre Litho Phone Call Attempted to contact client for PreProcedure Phone Call Unable to reach at this time. Left message, contact number and name and asked for return phone call.

## 2023-04-28 ENCOUNTER — Encounter (HOSPITAL_COMMUNITY): Payer: Self-pay | Admitting: Urology

## 2023-04-28 NOTE — Progress Notes (Addendum)
 Pre Litho Nurse Phone Call Spoke with client by phone today, verified name, MD, DOB and address and procedure Pt denies any Covid/ Flu s/s or exposure Pt denies any OSA Non-smoker Denies being currently pregnant (informed that urine pregnancy will be performed upon her arrival) Instructed to Hold... Green Tea Med, Ibuprofen 600mg , and MultiVitamins Instructed that she is to continue her Flomax and PRN meds of Oxy and Zofran Instructed on laxative to take, taking in PO fluids to stay hydrated, instructed to be NPO of solids after Sunday night at [facility] Night and Liquids to stop after 0300hrs Monday am Appropriate dress/ attire discussed Instructed to have a person with her for transportation and post procedure care, she stated her father will be coming with her Discussed arrival time to [facility] at [address] [facility] between 0915 and 0930hrs, on Monday, April 14th, 2025. Opportunity for questions provided prior to ending phone call and also provided a contact number for Monday am for concerns, cancellation or questions. Phone call ended

## 2023-04-30 ENCOUNTER — Other Ambulatory Visit: Payer: Self-pay

## 2023-04-30 ENCOUNTER — Ambulatory Visit (HOSPITAL_COMMUNITY)
Admission: RE | Admit: 2023-04-30 | Discharge: 2023-04-30 | Disposition: A | Discharge status: Home / Self Care | Source: Home / Self Care | Attending: Urology | Admitting: Urology

## 2023-04-30 ENCOUNTER — Encounter (HOSPITAL_COMMUNITY): Payer: Self-pay | Admitting: Urology

## 2023-04-30 ENCOUNTER — Encounter (HOSPITAL_COMMUNITY)
Admission: RE | Disposition: A | Discharge status: Home / Self Care | Payer: Self-pay | Source: Home / Self Care | Attending: Urology

## 2023-04-30 ENCOUNTER — Ambulatory Visit (HOSPITAL_COMMUNITY)
Admission: RE | Admit: 2023-04-30 | Discharge: 2023-04-30 | Disposition: A | Discharge status: Home / Self Care | Attending: Urology | Admitting: Urology

## 2023-04-30 ENCOUNTER — Ambulatory Visit (HOSPITAL_COMMUNITY)

## 2023-04-30 ENCOUNTER — Other Ambulatory Visit: Payer: Self-pay | Admitting: Urology

## 2023-04-30 DIAGNOSIS — N201 Calculus of ureter: Secondary | ICD-10-CM | POA: Diagnosis present

## 2023-04-30 DIAGNOSIS — J45909 Unspecified asthma, uncomplicated: Secondary | ICD-10-CM | POA: Diagnosis not present

## 2023-04-30 DIAGNOSIS — N200 Calculus of kidney: Secondary | ICD-10-CM

## 2023-04-30 HISTORY — DX: Hypothyroidism, unspecified: E03.9

## 2023-04-30 HISTORY — DX: Personal history of urinary calculi: Z87.442

## 2023-04-30 HISTORY — PX: EXTRACORPOREAL SHOCK WAVE LITHOTRIPSY: SHX1557

## 2023-04-30 LAB — POCT PREGNANCY, URINE: Preg Test, Ur: NEGATIVE

## 2023-04-30 SURGERY — LITHOTRIPSY, ESWL
Anesthesia: LOCAL | Laterality: Right

## 2023-04-30 SURGERY — LITHOTRIPSY, ESWL
Anesthesia: LOCAL

## 2023-04-30 MED ORDER — CIPROFLOXACIN HCL 500 MG PO TABS: 500.0000 mg | ORAL_TABLET | ORAL | Status: DC

## 2023-04-30 MED ORDER — SODIUM CHLORIDE 0.9 % IV SOLN: INTRAVENOUS | Status: DC

## 2023-04-30 MED ORDER — DIAZEPAM 5 MG PO TABS: 10.0000 mg | ORAL_TABLET | ORAL | Status: DC

## 2023-04-30 MED ORDER — DIPHENHYDRAMINE HCL 25 MG PO CAPS
25.0000 mg | ORAL_CAPSULE | Freq: Once | ORAL | Status: AC
  Administered 2023-04-30: 25 mg via ORAL
  Filled 2023-04-30: qty 1

## 2023-04-30 MED ORDER — DIPHENHYDRAMINE HCL 25 MG PO CAPS: 25.0000 mg | ORAL_CAPSULE | ORAL | Status: DC

## 2023-04-30 MED ORDER — TAMSULOSIN HCL 0.4 MG PO CAPS: 0.4000 mg | ORAL_CAPSULE | Freq: Every day | ORAL | 0 refills | Status: AC

## 2023-04-30 MED ORDER — ONDANSETRON HCL 4 MG PO TABS: 4.0000 mg | ORAL_TABLET | Freq: Every day | ORAL | 1 refills | Status: AC | PRN

## 2023-04-30 MED ORDER — TRAMADOL HCL 50 MG PO TABS: 50.0000 mg | ORAL_TABLET | Freq: Four times a day (QID) | ORAL | 0 refills | Status: AC | PRN

## 2023-04-30 MED ORDER — DIAZEPAM 5 MG PO TABS
10.0000 mg | ORAL_TABLET | Freq: Once | ORAL | Status: AC
  Administered 2023-04-30: 10 mg via ORAL
  Filled 2023-04-30: qty 2

## 2023-04-30 MED ORDER — CIPROFLOXACIN HCL 500 MG PO TABS
500.0000 mg | ORAL_TABLET | Freq: Once | ORAL | Status: AC
  Administered 2023-04-30: 500 mg via ORAL
  Filled 2023-04-30: qty 1

## 2023-04-30 NOTE — Op Note (Signed)
 ESWL Operative Note  Treating Physician: Yevonne Heman, MD  Pre-op diagnosis: 5 mm right ureteral stone  Post-op diagnosis: 5 mm right UVJ stone   Procedure: RIGHT ESWL  See [facility] OP note scanned into chart. Also because of the size, density, location and other factors that cannot be anticipated I feel this will likely be a staged procedure. This fact supersedes any indication in the scanned [place] stone operative note to the contrary

## 2023-04-30 NOTE — H&P (Signed)
See scanned [facility] documents for H&P.   

## 2023-05-02 ENCOUNTER — Encounter (HOSPITAL_COMMUNITY): Payer: Self-pay | Admitting: Urology
